# Patient Record
Sex: Female | Born: 1985 | Race: White | Hispanic: No | Marital: Single | Smoking: Current every day smoker
Health system: Southern US, Community
[De-identification: ages and names within clinical notes are randomized; demographics above are authoritative.]

## PROBLEM LIST (undated history)

## (undated) DIAGNOSIS — G8929 Other chronic pain: Secondary | ICD-10-CM

## (undated) DIAGNOSIS — N200 Calculus of kidney: Secondary | ICD-10-CM

---

## 2004-06-17 ENCOUNTER — Emergency Department: Payer: Self-pay | Admitting: Unknown Physician Specialty

## 2005-11-05 ENCOUNTER — Emergency Department: Payer: Self-pay | Admitting: Emergency Medicine

## 2005-11-07 ENCOUNTER — Emergency Department: Payer: Self-pay | Admitting: Emergency Medicine

## 2005-11-15 ENCOUNTER — Emergency Department: Payer: Self-pay | Admitting: Emergency Medicine

## 2006-07-21 ENCOUNTER — Emergency Department: Payer: Self-pay | Admitting: General Practice

## 2006-09-30 ENCOUNTER — Emergency Department: Payer: Self-pay | Admitting: Emergency Medicine

## 2007-03-02 ENCOUNTER — Emergency Department: Payer: Self-pay | Admitting: Internal Medicine

## 2007-04-04 ENCOUNTER — Emergency Department: Payer: Self-pay | Admitting: Emergency Medicine

## 2007-05-26 ENCOUNTER — Emergency Department: Payer: Self-pay | Admitting: Emergency Medicine

## 2007-06-05 ENCOUNTER — Emergency Department: Payer: Self-pay | Admitting: Emergency Medicine

## 2007-06-25 ENCOUNTER — Emergency Department: Payer: Self-pay | Admitting: Emergency Medicine

## 2007-08-05 ENCOUNTER — Emergency Department: Payer: Self-pay | Admitting: Emergency Medicine

## 2007-09-03 ENCOUNTER — Emergency Department: Payer: Self-pay | Admitting: Unknown Physician Specialty

## 2007-10-19 ENCOUNTER — Emergency Department: Payer: Self-pay | Admitting: Emergency Medicine

## 2007-12-01 ENCOUNTER — Emergency Department: Payer: Self-pay | Admitting: Emergency Medicine

## 2007-12-17 ENCOUNTER — Emergency Department: Payer: Self-pay | Admitting: Emergency Medicine

## 2007-12-31 ENCOUNTER — Emergency Department: Payer: Self-pay | Admitting: Emergency Medicine

## 2008-05-06 ENCOUNTER — Emergency Department: Payer: Self-pay | Admitting: Emergency Medicine

## 2008-06-06 ENCOUNTER — Emergency Department: Payer: Self-pay | Admitting: Emergency Medicine

## 2008-07-13 ENCOUNTER — Ambulatory Visit: Payer: Self-pay | Admitting: Obstetrics and Gynecology

## 2008-07-19 ENCOUNTER — Ambulatory Visit: Payer: Self-pay | Admitting: Obstetrics and Gynecology

## 2008-09-08 ENCOUNTER — Emergency Department: Payer: Self-pay | Admitting: Emergency Medicine

## 2008-11-20 ENCOUNTER — Emergency Department: Payer: Self-pay | Admitting: Internal Medicine

## 2009-08-16 ENCOUNTER — Emergency Department: Payer: Self-pay | Admitting: Emergency Medicine

## 2009-12-06 ENCOUNTER — Emergency Department: Payer: Self-pay | Admitting: Emergency Medicine

## 2009-12-14 ENCOUNTER — Ambulatory Visit: Payer: Self-pay | Admitting: Surgery

## 2009-12-19 LAB — PATHOLOGY REPORT

## 2010-09-06 ENCOUNTER — Emergency Department: Payer: Self-pay | Admitting: Emergency Medicine

## 2010-09-25 ENCOUNTER — Emergency Department: Payer: Self-pay | Admitting: Emergency Medicine

## 2010-09-28 ENCOUNTER — Emergency Department: Payer: Self-pay | Admitting: Unknown Physician Specialty

## 2010-10-29 ENCOUNTER — Emergency Department: Payer: Self-pay | Admitting: Internal Medicine

## 2010-11-02 ENCOUNTER — Emergency Department: Payer: Self-pay | Admitting: Emergency Medicine

## 2010-12-24 ENCOUNTER — Emergency Department: Payer: Self-pay | Admitting: Emergency Medicine

## 2011-01-23 ENCOUNTER — Emergency Department: Payer: Self-pay | Admitting: Emergency Medicine

## 2011-02-12 ENCOUNTER — Emergency Department: Payer: Self-pay | Admitting: Emergency Medicine

## 2011-04-09 ENCOUNTER — Emergency Department: Payer: Self-pay | Admitting: Emergency Medicine

## 2011-06-12 ENCOUNTER — Emergency Department: Payer: Self-pay | Admitting: Emergency Medicine

## 2011-06-28 ENCOUNTER — Emergency Department: Payer: Self-pay | Admitting: Emergency Medicine

## 2011-06-28 LAB — URINALYSIS, COMPLETE
Glucose,UR: NEGATIVE mg/dL (ref 0–75)
Ketone: NEGATIVE
Nitrite: NEGATIVE
Ph: 7 (ref 4.5–8.0)
Protein: 100
Specific Gravity: 1.019 (ref 1.003–1.030)
Squamous Epithelial: 11
WBC UR: 7 /HPF (ref 0–5)

## 2011-06-28 LAB — COMPREHENSIVE METABOLIC PANEL
Albumin: 4.1 g/dL (ref 3.4–5.0)
Alkaline Phosphatase: 74 U/L (ref 50–136)
Anion Gap: 9 (ref 7–16)
BUN: 10 mg/dL (ref 7–18)
Chloride: 107 mmol/L (ref 98–107)
Glucose: 86 mg/dL (ref 65–99)
Osmolality: 281 (ref 275–301)
Potassium: 3.8 mmol/L (ref 3.5–5.1)
SGOT(AST): 19 U/L (ref 15–37)
SGPT (ALT): 16 U/L
Total Protein: 7.7 g/dL (ref 6.4–8.2)

## 2011-06-28 LAB — CBC
HCT: 41.6 % (ref 35.0–47.0)
HGB: 14.1 g/dL (ref 12.0–16.0)
MCHC: 33.8 g/dL (ref 32.0–36.0)
MCV: 92 fL (ref 80–100)
Platelet: 315 10*3/uL (ref 150–440)
RBC: 4.54 10*6/uL (ref 3.80–5.20)
RDW: 12.8 % (ref 11.5–14.5)

## 2011-06-28 LAB — PREGNANCY, URINE: Pregnancy Test, Urine: NEGATIVE m[IU]/mL

## 2011-10-24 ENCOUNTER — Emergency Department: Payer: Self-pay | Admitting: Emergency Medicine

## 2011-10-24 LAB — CBC
HCT: 40.9 % (ref 35.0–47.0)
MCHC: 34.1 g/dL (ref 32.0–36.0)
MCV: 91 fL (ref 80–100)
Platelet: 291 10*3/uL (ref 150–440)
RDW: 13 % (ref 11.5–14.5)

## 2011-10-24 LAB — WET PREP, GENITAL

## 2011-10-24 LAB — URINALYSIS, COMPLETE
Bilirubin,UR: NEGATIVE
Ketone: NEGATIVE
Protein: NEGATIVE
Specific Gravity: 1.014 (ref 1.003–1.030)
WBC UR: 5 /HPF (ref 0–5)

## 2011-10-24 LAB — HCG, QUANTITATIVE, PREGNANCY: Beta Hcg, Quant.: 34898 m[IU]/mL — ABNORMAL HIGH

## 2012-01-15 ENCOUNTER — Ambulatory Visit: Payer: Self-pay | Admitting: Family Medicine

## 2012-02-05 ENCOUNTER — Observation Stay: Payer: Self-pay

## 2012-02-05 LAB — URINALYSIS, COMPLETE
Glucose,UR: NEGATIVE mg/dL (ref 0–75)
Ketone: NEGATIVE
Leukocyte Esterase: NEGATIVE
Nitrite: NEGATIVE
Ph: 7 (ref 4.5–8.0)
Protein: NEGATIVE
Specific Gravity: 1.001 (ref 1.003–1.030)

## 2012-02-18 ENCOUNTER — Observation Stay: Payer: Self-pay | Admitting: Obstetrics and Gynecology

## 2012-02-18 LAB — FETAL FIBRONECTIN
Appearance: NORMAL
Fetal Fibronectin: NEGATIVE

## 2012-02-18 LAB — URINALYSIS, COMPLETE
Bilirubin,UR: NEGATIVE
Blood: NEGATIVE
Ketone: NEGATIVE
Nitrite: NEGATIVE
Ph: 8 (ref 4.5–8.0)
Protein: NEGATIVE
WBC UR: 15 /HPF (ref 0–5)

## 2012-03-29 ENCOUNTER — Observation Stay: Payer: Self-pay

## 2012-03-29 LAB — URINALYSIS, COMPLETE
Bacteria: NONE SEEN
Nitrite: NEGATIVE
Ph: 5 (ref 4.5–8.0)
Protein: 100
WBC UR: 11 /HPF (ref 0–5)

## 2012-04-20 ENCOUNTER — Inpatient Hospital Stay: Payer: Self-pay | Admitting: Obstetrics & Gynecology

## 2012-04-20 LAB — CBC WITH DIFFERENTIAL/PLATELET
Eosinophil %: 0.5 %
HCT: 31.4 % — ABNORMAL LOW (ref 35.0–47.0)
MCH: 37.8 pg — ABNORMAL HIGH (ref 26.0–34.0)
MCV: 104 fL — ABNORMAL HIGH (ref 80–100)
Monocyte #: 1.3 x10 3/mm — ABNORMAL HIGH (ref 0.2–0.9)
Monocyte %: 6.1 %

## 2012-04-20 LAB — DRUG SCREEN, URINE
Benzodiazepine, Ur Scrn: NEGATIVE (ref ?–200)
Cocaine Metabolite,Ur ~~LOC~~: NEGATIVE (ref ?–300)
Phencyclidine (PCP) Ur S: NEGATIVE (ref ?–25)
Tricyclic, Ur Screen: NEGATIVE (ref ?–1000)

## 2012-04-21 LAB — HEMATOCRIT: HCT: 13 % — CL (ref 35.0–47.0)

## 2012-04-22 LAB — HEMATOCRIT: HCT: 17.3 % — ABNORMAL LOW (ref 35.0–47.0)

## 2012-06-29 ENCOUNTER — Emergency Department: Payer: Self-pay | Admitting: Internal Medicine

## 2012-11-13 ENCOUNTER — Emergency Department: Payer: Self-pay | Admitting: Emergency Medicine

## 2012-12-08 ENCOUNTER — Emergency Department: Payer: Self-pay | Admitting: Emergency Medicine

## 2013-03-18 ENCOUNTER — Emergency Department: Payer: Self-pay | Admitting: Emergency Medicine

## 2013-03-18 LAB — RAPID INFLUENZA A&B ANTIGENS

## 2013-03-19 LAB — BETA STREP CULTURE(ARMC)

## 2013-03-21 ENCOUNTER — Emergency Department: Payer: Self-pay | Admitting: Emergency Medicine

## 2013-03-21 LAB — BASIC METABOLIC PANEL
Anion Gap: 6 — ABNORMAL LOW (ref 7–16)
BUN: 8 mg/dL (ref 7–18)
CO2: 27 mmol/L (ref 21–32)
CREATININE: 0.59 mg/dL — AB (ref 0.60–1.30)
Calcium, Total: 9.3 mg/dL (ref 8.5–10.1)
Chloride: 106 mmol/L (ref 98–107)
EGFR (African American): >60
Glucose: 91 mg/dL (ref 65–99)
Osmolality: 275 (ref 275–301)
Potassium: 4.1 mmol/L (ref 3.5–5.1)
SODIUM: 139 mmol/L (ref 136–145)

## 2013-03-21 LAB — CBC
HCT: 39 % (ref 35.0–47.0)
HGB: 12.9 g/dL (ref 12.0–16.0)
MCH: 29.2 pg (ref 26.0–34.0)
MCHC: 33.1 g/dL (ref 32.0–36.0)
MCV: 88 fL (ref 80–100)
PLATELETS: 367 10*3/uL (ref 150–440)
RBC: 4.41 10*6/uL (ref 3.80–5.20)
RDW: 13.5 % (ref 11.5–14.5)
WBC: 12.4 10*3/uL — AB (ref 3.6–11.0)

## 2013-03-24 LAB — BETA STREP CULTURE(ARMC)

## 2013-04-05 ENCOUNTER — Emergency Department: Payer: Self-pay | Admitting: Emergency Medicine

## 2013-06-23 ENCOUNTER — Emergency Department: Payer: Self-pay | Admitting: Emergency Medicine

## 2013-06-23 LAB — URINALYSIS, COMPLETE
Bacteria: NONE SEEN
Bilirubin,UR: NEGATIVE
Blood: NEGATIVE
GLUCOSE, UR: NEGATIVE mg/dL (ref 0–75)
Ketone: NEGATIVE
Nitrite: NEGATIVE
Ph: 6 (ref 4.5–8.0)
Protein: NEGATIVE
RBC,UR: 3 /HPF (ref 0–5)
Specific Gravity: 1.021 (ref 1.003–1.030)
Squamous Epithelial: 23
WBC UR: 3 /HPF (ref 0–5)

## 2013-06-23 LAB — CBC
HCT: 38.6 % (ref 35.0–47.0)
HGB: 12.8 g/dL (ref 12.0–16.0)
MCH: 29.9 pg (ref 26.0–34.0)
MCHC: 33.2 g/dL (ref 32.0–36.0)
MCV: 90 fL (ref 80–100)
PLATELETS: 341 10*3/uL (ref 150–440)
RBC: 4.29 10*6/uL (ref 3.80–5.20)
RDW: 14.1 % (ref 11.5–14.5)
WBC: 10.7 10*3/uL (ref 3.6–11.0)

## 2013-06-23 LAB — PREGNANCY, URINE: Pregnancy Test, Urine: POSITIVE m[IU]/mL

## 2013-06-23 LAB — HCG, QUANTITATIVE, PREGNANCY: Beta Hcg, Quant.: 2594 m[IU]/mL — ABNORMAL HIGH

## 2013-12-28 ENCOUNTER — Emergency Department: Payer: Self-pay | Admitting: Emergency Medicine

## 2014-02-02 IMAGING — US US OB < 14 WEEKS - US OB TV
1 series · 14 of 28 positions shown · non-contrast
Comparison: none

REASON FOR EXAM: vaginal bleeding in pregnancy
COMMENTS:

PROCEDURE:     US  - US OB LESS THAN 14 WEEKS/W TRANS  - October 24, 2011  [DATE]
RESULT:     History: Bleeding.
Comparison Study: No prior.

[Series 1: us ob < 14 weeks - us ob tv · 0.30mm/px · 14 of 50 slices shown]
[im 2/50]
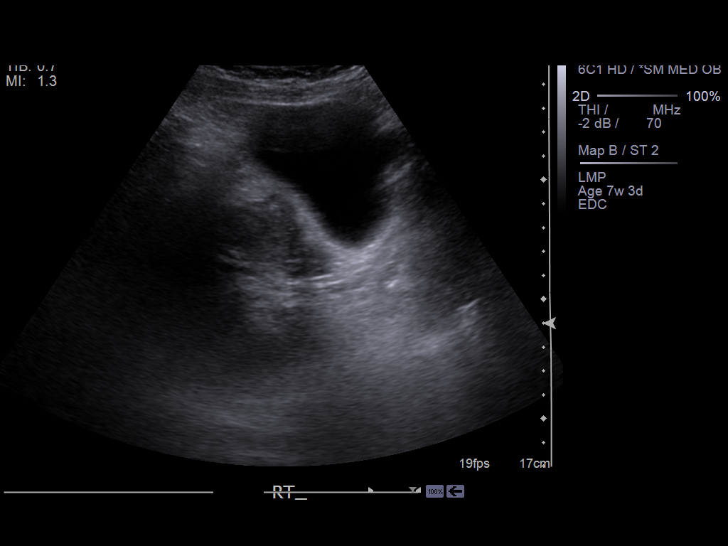
[im 6/50]
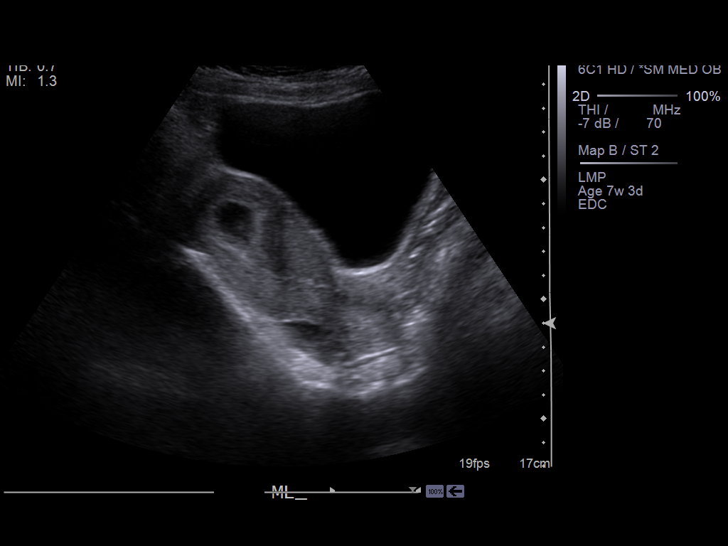
[im 10/50]
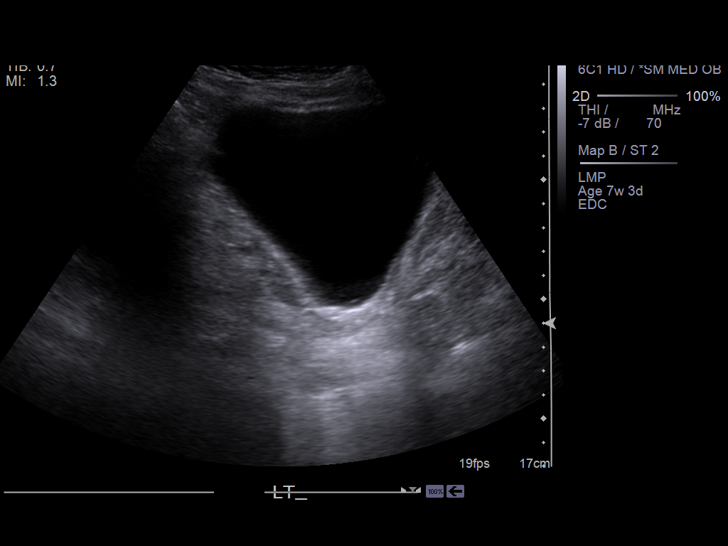
[im 13/50]
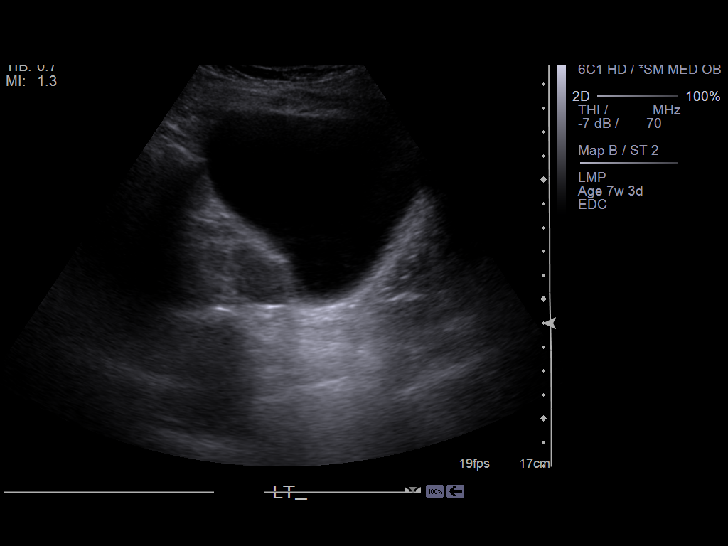
[im 17/50]
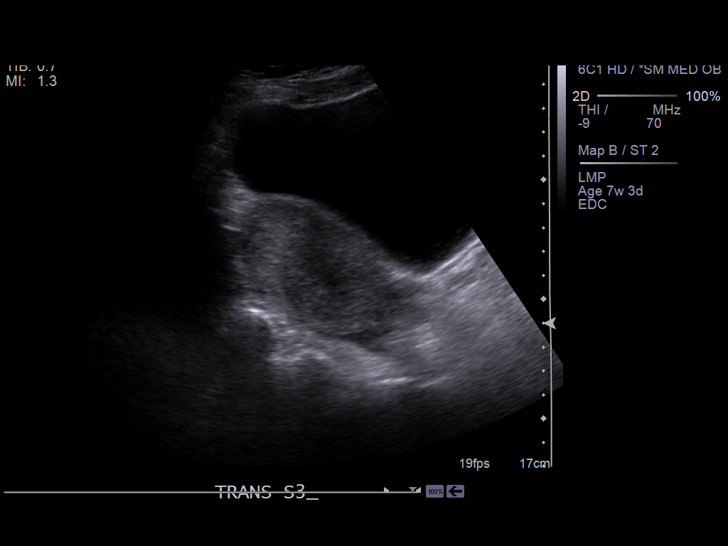
[im 20/50]
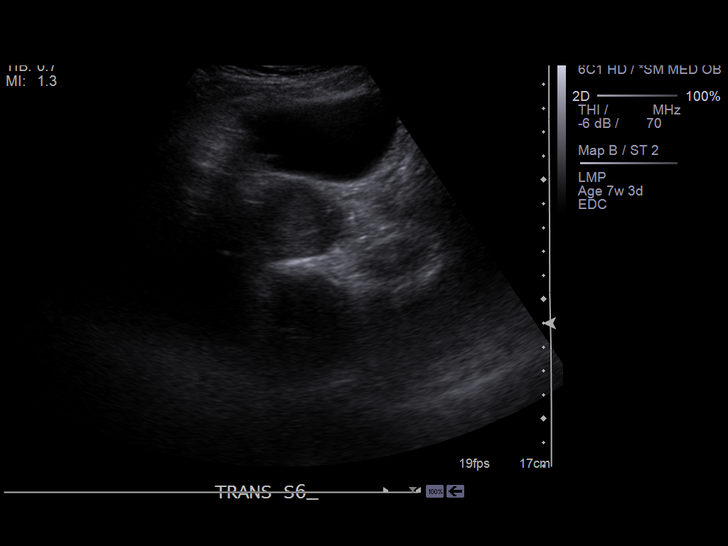
[im 24/50]
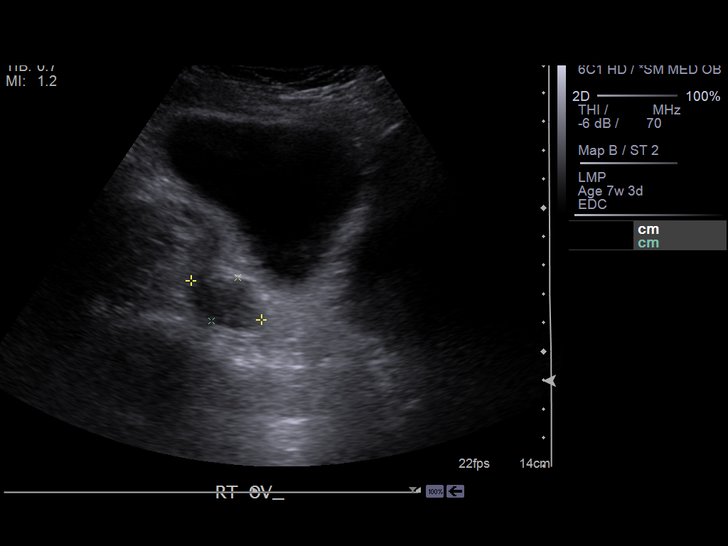
[im 28/50]
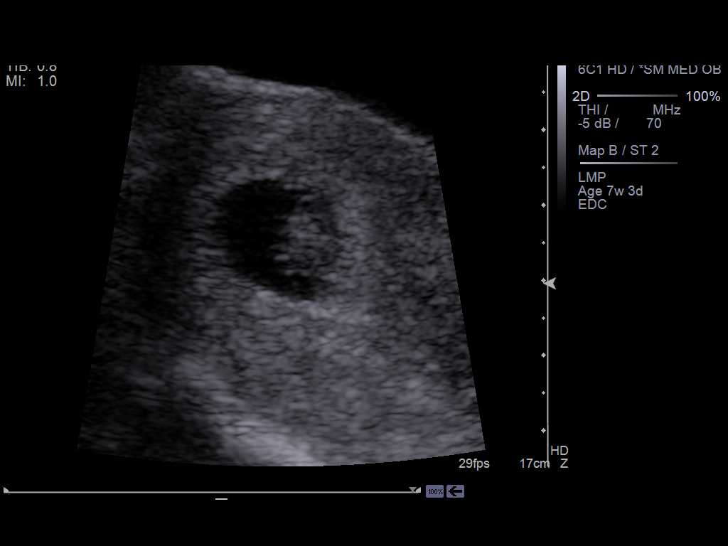
[im 31/50]
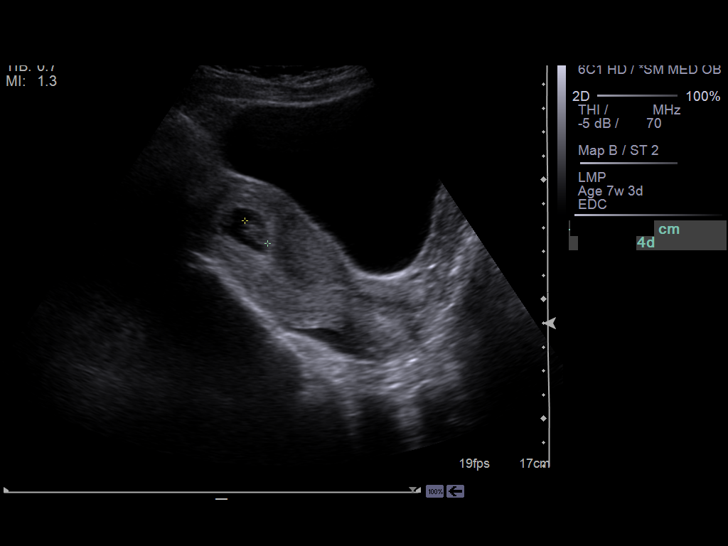
[im 35/50]
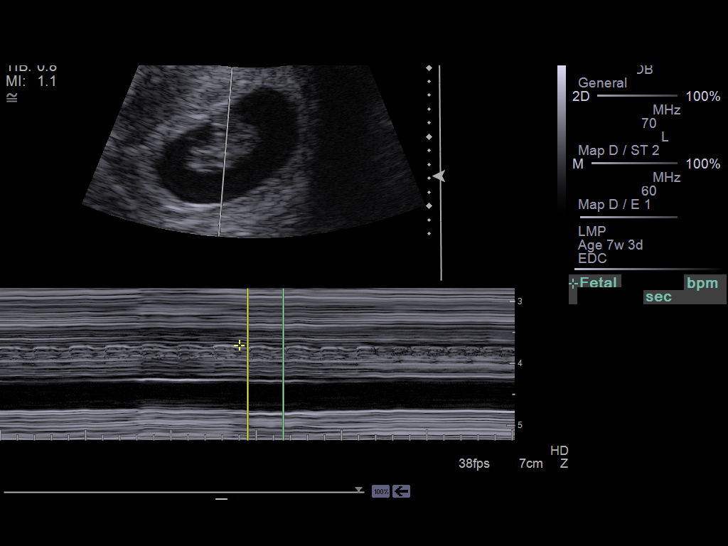
[im 39/50]
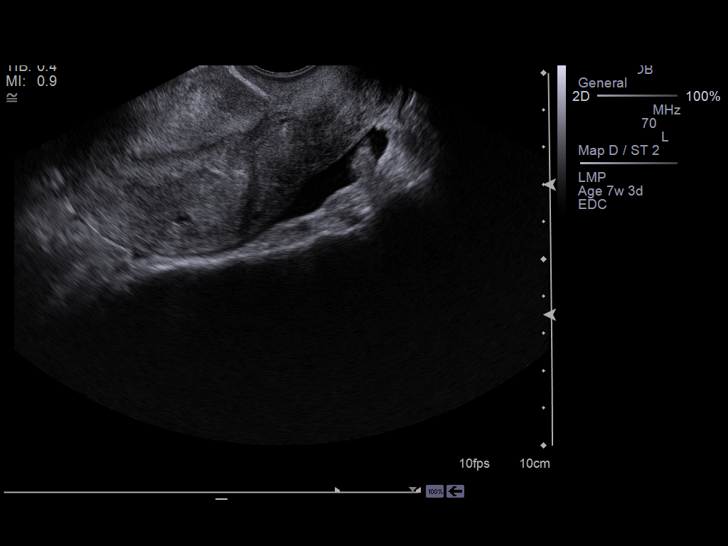
[im 42/50]
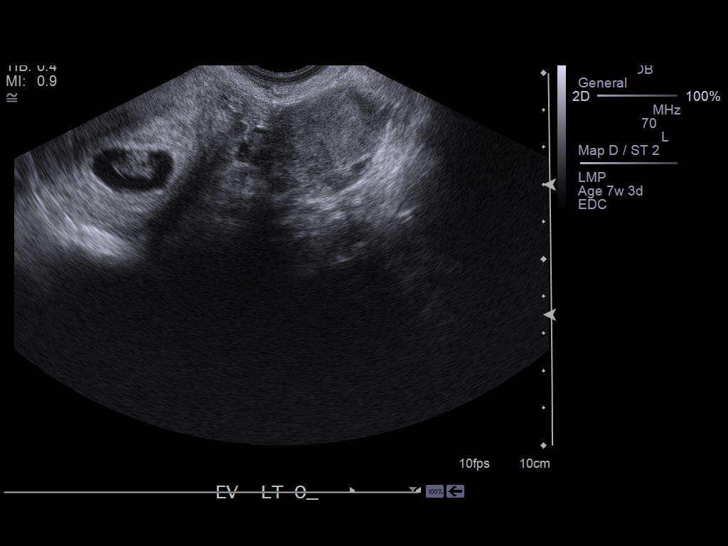
[im 46/50]
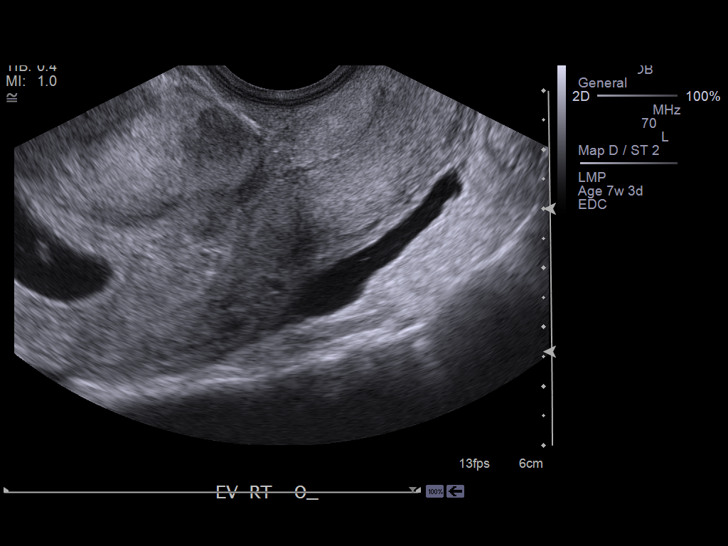
[im 50/50]
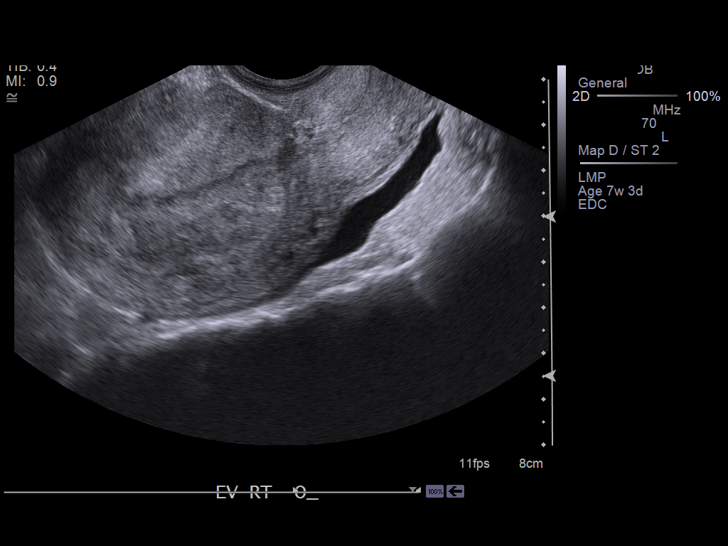

[14 of 28 positions shown; findings below may reference images not displayed]

FINDINGS: Single viable intrauterine is present with crown-rump length of
1.35 cm corresponding to 7 weeks 4 days. Fetal heart rate of 144 beats per
minute noted. Trace fluid in cul-de-sac. Ovaries are unremarkable.
IMPRESSION: Single viable intrauterine pregnancy at 7 weeks 4 days.
Trace amount fluid noted cul-de-sac.

## 2014-07-09 NOTE — Op Note (Signed)
PATIENT NAME:  Courtney Schwartz, Courtney Schwartz MR#:  [id number] DATE OF BIRTH:  02-24-1986  DATE OF PROCEDURE:  04/20/2012  PREOPERATIVE DIAGNOSIS: Intrauterine fetal demise at 33 weeks, placental abruption.  POSTOPERATIVE DIAGNOSIS: Intrauterine fetal demise at 33 weeks, placental abruption.  PROCEDURE: Low transverse cesarean section, repeat.   SURGEON: Senaida LangeLashawn Weaver-Lee, M.D.   ASSISTANRonnald Ramp: Tammy Gordy, scrub tech.   ANESTHESIA: Spinal.   ESTIMATED BLOOD LOSS: 500 mL.  OPERATIVE FLUIDS: 1 L.   COMPLICATIONS: None.   FINDINGS: Placental abruption, female vertex infant, 3 pounds 14 ounces, Apgars 0 and 0. Normal uterus, tubes, and ovaries. Placenta to pathology as a specimen.  INDICATION: The patient is a 29 year old, who presents with the complaint of back pain and contractions. Upon attempting to identify fetal heart tones for monitoring, the nursing staff was unable to do so. A bedside ultrasound was performed and no fetal cardiac activity was identified. IUFD was then diagnosed. The patient subsequently had several gushes of blood making placental abruption likely. The patient had a history of a prior cesarean section and desired repeat cesarean section. Risks of VBAC versus repeat cesarean was explained to the patient.   PROCEDURE: The patient was taken to the operating room with IV fluids running. She was prepped and draped in the usual sterile fashion with a leftward tilt. A Pfannenstiel skin incision was made and carried down to underlying fascia with the knife. The fascia was nicked in the midline. The incision was extended laterally. The superior aspect of the fascia was grasped with Kocher clamps and the underlying rectus muscles were dissected off. The peritoneum had been entered at this time and the opening was extended. The bladder blade was placed. The vesicouterine peritoneum was grasped with the pick-ups and entered sharply with the Metzenbaum. A bladder flap was created digitally. Bladder  blade was then replaced, and a hysterotomy incision was made and carried down to the underlying fetal membranes, which were ruptured. At this time, copious amounts of blood clot were encountered. The infant's head was grasped and delivered through the hysterotomy incision. The anterior and posterior shoulders were delivered followed by the remainder of the body. The cord was clamped x 2 and cut. The placenta was found to be free floating at this time secondary to abruption and it was easily pulled out of the uterus via the umbilical core. The uterus was exteriorized and cleared of all clot and debris. The hysterotomy incision was closed with a #0 Vicryl suture in a running locked fashion. The uterus was returned to the abdomen. The abdomen and gutters were irrigated with copious amounts of warm normal saline. The peritoneum was closed with 2-0 Vicryl. The On-Q pump apparatus was placed according to manufacturer's instructions. The fascia was closed with a #1 PDS. The skin was closed with staples. The openings through which the On-Q catheters were placed were secured using Dermabond skin glue. The catheters were then bolused with 0.5% Sensorcaine, 5 mm in each catheter. The catheters were secured to the abdomen using Steri-Strips and Tegaderm. The patient tolerated the procedure well. Sponge, needle and instrument counts were correct x 2. The patient was taken to the recovery room in stable condition.  ____________________________ Sonda PrimesLashawn A. Patton SallesWeaver-Lee, MD law:aw D: 04/20/2012 12:52:52 ET T: 04/21/2012 07:36:10 ET JOB#: [id number]  cc: Flint MelterLashawn A. Patton SallesWeaver-Lee, MD, <Dictator> Sonda PrimesLASHAWN A WEAVER LEE MD ELECTRONICALLY SIGNED 04/25/2012 8:00
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
## 2014-07-27 NOTE — H&P (Signed)
L&D Evaluation:  History Expanded:   HPI 29 year old G2 P1001 presented a 6630 1/7 with EDD of 06/06/12 weeks with c/o nausea, vomiting and diarrhea along with low abdominal cramping since 0600 this am. + sick contacts with similar symptoms. PNC at [facility]. H/o C-section for FTP in 2005.    Gravida 2    Term 1    PreTerm 0    Abortion 0    Living 1    Blood Type (Maternal) O positive    Group B Strep Results Maternal (Result >5wks must be treated as unknown) unknown/result > 5 weeks ago    Maternal HIV Negative    Maternal Syphilis Ab Nonreactive    Maternal Varicella Immune    Rubella Results (Maternal) immune    Maternal T-Dap Immune    EDC 09-Jun-2012    Presents with contractions    Patient's Medical History Endometriosis, migraines    Patient's Surgical History Previous C-Section  Laprascopic cholecystectomy, dx laparoscopy, internal fixation of  fx of left femur    Medications Pre Natal Vitamins  Tylenol (Acetaminophen)    Allergies Phenergan (hallucinations), morphine (rash), Tramadol (N/V/D), Darvocet (N/V/D), Toradol (N/V/D), shellfish (rash), Latex (rash).    Social History tobacco  5 cigs/day    Family History Non-Contributory    Current Prenatal Course Notable For back pain   ROS:   ROS see HPI    General feels hot    MS s   Exam:   Vital Signs BP stable, pulse 120s, T=99.5    General appears uncomfortable    Mental Status clear    Chest clear    Abdomen gravid, mild generalized tenderness    Edema no edema    Reflexes 2+    Pelvic no external lesions, cervix closed and thick    Mebranes Intact    FHT baseline 165, + accelerations, mod variability    Ucx irregular    Skin dry   Impression:   Impression IUP at 30 weeks, suspected viral gastroenteritis   Plan:   Plan UA    Comments IV fluids, 500 mL bolus IV Zofran Kpad/warm blankets for cramping pain   Electronic Signatures: Vella KohlerBrothers, Bruk Tumolo K (CNM)  (Signed 11-Jan-14  20:34)  Authored: L&D Evaluation   Last Updated: 11-Jan-14 20:34 by Vella KohlerBrothers, Alejandria Wessells K (CNM)
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
## 2015-03-21 ENCOUNTER — Emergency Department
Admission: EM | Admit: 2015-03-21 | Discharge: 2015-03-21 | Disposition: A | Discharge status: Home / Self Care | Payer: Medicaid Other | Attending: Emergency Medicine | Admitting: Emergency Medicine

## 2015-03-21 ENCOUNTER — Emergency Department: Payer: Medicaid Other

## 2015-03-21 DIAGNOSIS — W010XXA Fall on same level from slipping, tripping and stumbling without subsequent striking against object, initial encounter: Secondary | ICD-10-CM | POA: Insufficient documentation

## 2015-03-21 DIAGNOSIS — Y9301 Activity, walking, marching and hiking: Secondary | ICD-10-CM | POA: Diagnosis not present

## 2015-03-21 DIAGNOSIS — Y9289 Other specified places as the place of occurrence of the external cause: Secondary | ICD-10-CM | POA: Diagnosis not present

## 2015-03-21 DIAGNOSIS — Y998 Other external cause status: Secondary | ICD-10-CM | POA: Diagnosis not present

## 2015-03-21 DIAGNOSIS — S93402A Sprain of unspecified ligament of left ankle, initial encounter: Secondary | ICD-10-CM | POA: Diagnosis not present

## 2015-03-21 DIAGNOSIS — S99922A Unspecified injury of left foot, initial encounter: Secondary | ICD-10-CM | POA: Diagnosis present

## 2015-03-21 MED ORDER — NAPROXEN 500 MG PO TABS: 500.0000 mg | ORAL_TABLET | Freq: Two times a day (BID) | ORAL | Status: DC

## 2015-03-21 NOTE — ED Notes (Signed)
AAOx3.  Skin warm and dry.  NAD 

## 2015-03-21 NOTE — ED Provider Notes (Signed)
[facility] Emergency Department Provider Note ?  ? ____________________________________________ ? Time seen: 9:40 PM ? I have reviewed the triage vital signs and the nursing notes.  ________ HISTORY ? Chief Complaint Foot Pain     HPI  Courtney Schwartz is a 30 y.o. female   who presents emergency department complaining of left foot pain. She states that she was walking down a flight of outside stairs when she slipped falling landing on the left foot. She states that she has a history of spraining left ankle and the pain is severe at this time. She also endorses hearing a "pop" to her foot during fall. ? ? ? No past medical history on file.  There are no active problems to display for this patient.  ? No past surgical history on file. ? Current Outpatient Rx  Name  Route  Sig  Dispense  Refill  . naproxen (NAPROSYN) 500 MG tablet   Oral   Take 1 tablet (500 mg total) by mouth 2 (two) times daily with a meal.   60 tablet   0    ? Allergies Morphine and related; Toradol; and Tramadol ? No family history on file. ? Social History Social History  Substance Use Topics  . Smoking status: Not on file  . Smokeless tobacco: Not on file  . Alcohol Use: Not on file   ? Review of Systems Constitutional: no fever. Eyes: no discharge ENT: no sore throat. Cardiovascular: no chest pain. Respiratory: no cough. No sob Gastrointestinal: denies abdominal pain, vomiting, diarrhea, and constipation Genitourinary: no dysuria. Negative for hematuria Musculoskeletal: Negative for back pain. Endorses left foot pain. Skin: Negative for rash. Neurological: Negative for headaches  10-point ROS otherwise negative.  _______________ PHYSICAL EXAM: ? VITAL SIGNS:   ED Triage Vitals  Enc Vitals Group     BP 03/21/15 1942 126/90 mmHg     Pulse Rate 03/21/15 1942 105     Resp 03/21/15 1942 20     Temp 03/21/15 1942 97.9 F (36.6 C)     Temp Source  03/21/15 1942 Oral     SpO2 03/21/15 1942 97 %     Weight 03/21/15 1942 135 lb (61.236 kg)     Height 03/21/15 1942 5\' 1"  (1.549 m)     Head Cir --      Peak Flow --      Pain Score 03/21/15 1943 8     Pain Loc --      Pain Edu? --      Excl. in GC? --    ?  Constitutional: Alert and oriented. Well appearing and in no distress. Eyes: Conjunctivae are normal.  ENT      Head: Normocephalic and atraumatic.      Ears:       Nose: No congestion/rhinnorhea.      Mouth/Throat: Mucous membranes are moist.   Hematological/Lymphatic/Immunilogical: No cervical lymphadenopathy. Cardiovascular: Normal rate, regular rhythm. Normal S1 and S2. Respiratory: Normal respiratory effort without tachypnea nor retractions. Lungs CTAB. Gastrointestinal: Soft and nontender. No distention. There is no CVA tenderness. Genitourinary:  Musculoskeletal: Nontender with normal range of motion in all extremities. No visible deformity to left foot when compared with right. Limited range of motion of ankle due to pain. Patient is tender to palpation over the tarsal bones. No palpable abnormality. Dorsalis pedis pulse is palpated. Sensation is present and equal to the unaffected foot.  Neurologic:  Normal speech and language. No gross focal neurologic deficits  are appreciated. Skin:  Skin is warm, dry and intact. No rash noted. Psychiatric: Mood and affect are normal. Speech and behavior are normal. Patient exhibits appropriate insight and judgment.    LABS (all labs ordered are listed, but only abnormal results are displayed)  Labs Reviewed - No data to display  ___________ RADIOLOGY  Left foot x-ray Impression: No fracture dislocation.   I personally reviewed the images.  _____________ PROCEDURES ? Procedure(s) performed:    Medications - No data to display  ______________________________________________________ INITIAL IMPRESSION / ASSESSMENT AND PLAN / ED COURSE ? Pertinent labs & imaging  results that were available during my care of the patient were reviewed by me and considered in my medical decision making (see chart for details).    Patient's diagnosis is consistent with left ankle sprain. She will be given Ace bandage her in emergency department and discharged home with anti-inflammatories and pain medication. Patient will follow-up with orthopedics if symptoms persist past treatment course. Patient verbalizes understanding of her diagnosis and treatment plan and verbalizes compliance with same.    New Prescriptions   NAPROXEN (NAPROSYN) 500 MG TABLET    Take 1 tablet (500 mg total) by mouth 2 (two) times daily with a meal.   ____________________________________________ FINAL CLINICAL IMPRESSION(S) / ED DIAGNOSES?  Final diagnoses:  Ankle sprain, left, initial encounter            Racheal Patches, PA-C 03/21/15 [id number]  Jennye Moccasin, MD 03/21/15 2328

## 2015-03-21 NOTE — Discharge Instructions (Signed)
Ankle Sprain °An ankle sprain is an injury to the strong, fibrous tissues (ligaments) that hold the bones of your ankle joint together.  °CAUSES °An ankle sprain is usually caused by a fall or by twisting your ankle. Ankle sprains most commonly occur when you step on the outer edge of your foot, and your ankle turns inward. People who participate in sports are more prone to these types of injuries.  °SYMPTOMS  °· Pain in your ankle. The pain may be present at rest or only when you are trying to stand or walk. °· Swelling. °· Bruising. Bruising may develop immediately or within 1 to 2 days after your injury. °· Difficulty standing or walking, particularly when turning corners or changing directions. °DIAGNOSIS  °Your caregiver will ask you details about your injury and perform a physical exam of your ankle to determine if you have an ankle sprain. During the physical exam, your caregiver will press on and apply pressure to specific areas of your foot and ankle. Your caregiver will try to move your ankle in certain ways. An X-ray exam may be done to be sure a bone was not broken or a ligament did not separate from one of the bones in your ankle (avulsion fracture).  °TREATMENT  °Certain types of braces can help stabilize your ankle. Your caregiver can make a recommendation for this. Your caregiver may recommend the use of medicine for pain. If your sprain is severe, your caregiver may refer you to a surgeon who helps to restore function to parts of your skeletal system (orthopedist) or a physical therapist. °HOME CARE INSTRUCTIONS  °· Apply ice to your injury for 1-2 days or as directed by your caregiver. Applying ice helps to reduce inflammation and pain. °· Put ice in a plastic bag. °· Place a towel between your skin and the bag. °· Leave the ice on for 15-20 minutes at a time, every 2 hours while you are awake. °· Only take over-the-counter or prescription medicines for pain, discomfort, or fever as directed by  your caregiver. °· Elevate your injured ankle above the level of your heart as much as possible for 2-3 days. °· If your caregiver recommends crutches, use them as instructed. Gradually put weight on the affected ankle. Continue to use crutches or a cane until you can walk without feeling pain in your ankle. °· If you have a plaster splint, wear the splint as directed by your caregiver. Do not rest it on anything harder than a pillow for the first 24 hours. Do not put weight on it. Do not get it wet. You may take it off to take a shower or bath. °· You may have been given an elastic bandage to wear around your ankle to provide support. If the elastic bandage is too tight (you have numbness or tingling in your foot or your foot becomes cold and blue), adjust the bandage to make it comfortable. °· If you have an air splint, you may blow more air into it or let air out to make it more comfortable. You may take your splint off at night and before taking a shower or bath. Wiggle your toes in the splint several times per day to decrease swelling. °SEEK MEDICAL CARE IF:  °· You have rapidly increasing bruising or swelling. °· Your toes feel extremely cold or you lose feeling in your foot. °· Your pain is not relieved with medicine. °SEEK IMMEDIATE MEDICAL CARE IF: °· Your toes are numb or blue. °·   You have severe pain that is increasing. °MAKE SURE YOU:  °· Understand these instructions. °· Will watch your condition. °· Will get help right away if you are not doing well or get worse. °  °This information is not intended to replace advice given to you by your health care provider. Make sure you discuss any questions you have with your health care provider. °  °Document Released: 03/05/2005 Document Revised: 03/26/2014 Document Reviewed: 03/17/2011 °Elsevier Interactive Patient Education ©2016 [employer] ° °Elastic Bandage and RICE °WHAT DOES AN ELASTIC BANDAGE DO? °Elastic bandages come in different shapes and sizes.  They generally provide support to your injury and reduce swelling while you are healing, but they can perform different functions. Your health care provider will help you to decide what is best for your protection, recovery, or rehabilitation following an injury. °WHAT ARE SOME GENERAL TIPS FOR USING AN ELASTIC BANDAGE? °· Use the bandage as directed by the maker of the bandage that you are using. °· Do not wrap the bandage too tightly. This may cut off the circulation in the arm or leg in the area below the bandage. °· If part of your body beyond the bandage becomes blue, numb, cold, swollen, or is more painful, your bandage is most likely too tight. If this occurs, remove your bandage and reapply it more loosely. °· See your health care provider if the bandage seems to be making your problems worse rather than better. °· An elastic bandage should be removed and reapplied every 3-4 hours or as directed by your health care provider. °WHAT IS RICE? °The routine care of many injuries includes rest, ice, compression, and elevation (RICE therapy).  °Rest °Rest is required to allow your body to heal. Generally, you can resume your routine activities when you are comfortable and have been given permission by your health care provider. °Ice °Icing your injury helps to keep the swelling down and it reduces pain. Do not apply ice directly to your skin. °· Put ice in a plastic bag. °· Place a towel between your skin and the bag. °· Leave the ice on for 20 minutes, 2-3 times per day. °Do this for as long as you are directed by your health care provider. °Compression °Compression helps to keep swelling down, gives support, and helps with discomfort. Compression may be done with an elastic bandage. °Elevation °Elevation helps to reduce swelling and it decreases pain. If possible, your injured area should be placed at or above the level of your heart or the center of your chest. °WHEN SHOULD I SEEK MEDICAL CARE? °You should seek  medical care if: °· You have persistent pain and swelling. °· Your symptoms are getting worse rather than improving. °These symptoms may indicate that further evaluation or further X-rays are needed. Sometimes, X-rays may not show a small broken bone (fracture) until a number of days later. Make a follow-up appointment with your health care provider. Ask when your X-ray results will be ready. Make sure that you get your X-ray results. °WHEN SHOULD I SEEK IMMEDIATE MEDICAL CARE? °You should seek immediate medical care if: °· You have a sudden onset of severe pain at or below the area of your injury. °· You develop redness or increased swelling around your injury. °· You have tingling or numbness at or below the area of your injury that does not improve after you remove the elastic bandage. °  °This information is not intended to replace advice given to you by your   health care provider. Make sure you discuss any questions you have with your health care provider. °  °Document Released: 08/25/2001 Document Revised: 11/24/2014 Document Reviewed: 10/19/2013 °Elsevier Interactive Patient Education ©2016 [employer] ° °Stirrup Ankle Brace °Stirrup ankle braces give support and help stabilize the ankle joint. They are rigid pieces of plastic or fiberglass that go up both sides of the lower leg with the bottom of the stirrup fitting comfortably under the bottom of the instep of the foot. It can be held on with Velcro straps or an elastic wrap. Stirrup ankle braces are used to support the ankle following mild or moderate sprains or strains, or fractures after cast removal.  °They can be easily removed or adjusted if there is swelling. The rigid brace shells are designed to fit the ankle comfortably and provide the needed medial/lateral stabilization. This brace can be easily worn with most athletic shoes. The brace liner is usually made of a soft, comfortable gel-like material. This gel fits the ankle well without causing  uncomfortable pressure points.  °IMPORTANCE OF ANKLE BRACES: °· The use of ankle bracing is effective in the prevention of ankle sprains. °· In athletes, the use of ankle bracing will offer protection and prevent further sprains. °· Research shows that a complete rehabilitation program needs to be included with external bracing. This includes range of motion and ankle strengthening exercises. Your caregivers will instruct you in this. °If you were given the brace today for a new injury, use the following home care instructions as a guide. °HOME CARE INSTRUCTIONS  °· Apply ice to the sore area for 15-20 minutes, 03-04 times per day while awake for the first 2 days. Put the ice in a plastic bag and place a towel between the bag of ice and your skin. Never place the ice pack directly on your skin. Be especially careful using ice on an elbow or knee or other bony area, such as your ankle, because icing for too long may damage the nerves which are close to the surface. °· Keep your leg elevated when possible to lessen swelling. °· Wear your splint until you are seen for a follow-up examination. Do not put weight on it. Do not get it wet. You may take it off to take a shower or bath. °· For Activity: Use crutches with non-weight bearing for 1 week. Then, you may walk on your ankle as instructed. Start gradually with weight bearing on the affected ankle. °· Continue to use crutches or a cane until you can stand on your ankle without causing pain. °· Wiggle your toes in the splint several times per day if you are able. °· The splint is too tight if you have numbness, tingling, or if your foot becomes cold and blue. Adjust the straps or elastic bandage to make it comfortable. °· Only take over-the-counter or prescription medicines for pain, discomfort, or fever as directed by your caregiver. °SEEK IMMEDIATE MEDICAL CARE IF:  °· You have increased bruising, swelling or pain. °· Your toes are blue or cold and loosening the  brace or wrap does not help. °· Your pain is not relieved with medicine. °MAKE SURE YOU:  °· Understand these instructions. °· Will watch your condition. °· Will get help right away if you are not doing well or get worse. °  °This information is not intended to replace advice given to you by your health care provider. Make sure you discuss any questions you have with your health care provider. °  °  Document Released: 01/04/2004 Document Revised: 05/28/2011 Document Reviewed: 10/05/2014 °Elsevier Interactive Patient Education ©2016 [employer]vier [employer]. ° °

## 2015-03-21 NOTE — ED Notes (Signed)
Pt to triage via w/c with no distress noted; reports injuring left foot today at 3pm, st "heard a crack"

## 2016-07-15 ENCOUNTER — Emergency Department
Admission: EM | Admit: 2016-07-15 | Discharge: 2016-07-15 | Disposition: A | Discharge status: Home / Self Care | Payer: Medicaid Other | Attending: Emergency Medicine | Admitting: Emergency Medicine

## 2016-07-15 ENCOUNTER — Encounter: Payer: Self-pay | Admitting: Emergency Medicine

## 2016-07-15 DIAGNOSIS — R319 Hematuria, unspecified: Secondary | ICD-10-CM

## 2016-07-15 DIAGNOSIS — F17200 Nicotine dependence, unspecified, uncomplicated: Secondary | ICD-10-CM | POA: Insufficient documentation

## 2016-07-15 DIAGNOSIS — R1031 Right lower quadrant pain: Secondary | ICD-10-CM | POA: Diagnosis present

## 2016-07-15 DIAGNOSIS — N390 Urinary tract infection, site not specified: Secondary | ICD-10-CM | POA: Insufficient documentation

## 2016-07-15 HISTORY — DX: Calculus of kidney: N20.0

## 2016-07-15 HISTORY — DX: Other chronic pain: G89.29

## 2016-07-15 LAB — URINALYSIS, COMPLETE (UACMP) WITH MICROSCOPIC
BILIRUBIN URINE: NEGATIVE
Glucose, UA: NEGATIVE mg/dL
Ketones, ur: NEGATIVE mg/dL
NITRITE: NEGATIVE
PROTEIN: 100 mg/dL — AB
Specific Gravity, Urine: 1.028 (ref 1.005–1.030)
pH: 5 (ref 5.0–8.0)

## 2016-07-15 LAB — COMPREHENSIVE METABOLIC PANEL
ALBUMIN: 4 g/dL (ref 3.5–5.0)
ALK PHOS: 77 U/L (ref 38–126)
ALT: 10 U/L — ABNORMAL LOW (ref 14–54)
ANION GAP: 8 (ref 5–15)
AST: 17 U/L (ref 15–41)
BILIRUBIN TOTAL: 0.4 mg/dL (ref 0.3–1.2)
BUN: 11 mg/dL (ref 6–20)
CO2: 26 mmol/L (ref 22–32)
CREATININE: 0.71 mg/dL (ref 0.44–1.00)
Calcium: 9.1 mg/dL (ref 8.9–10.3)
Chloride: 106 mmol/L (ref 101–111)
GFR calc non Af Amer: >60 mL/min (ref >60)
GLUCOSE: 95 mg/dL (ref 65–99)
Potassium: 3.8 mmol/L (ref 3.5–5.1)
Sodium: 140 mmol/L (ref 135–145)
TOTAL PROTEIN: 7.6 g/dL (ref 6.5–8.1)

## 2016-07-15 LAB — CBC
HCT: 39 % (ref 35.0–47.0)
HEMOGLOBIN: 12.9 g/dL (ref 12.0–16.0)
MCH: 27.7 pg (ref 26.0–34.0)
MCHC: 33 g/dL (ref 32.0–36.0)
MCV: 84 fL (ref 80.0–100.0)
PLATELETS: 351 10*3/uL (ref 150–440)
RBC: 4.65 MIL/uL (ref 3.80–5.20)
RDW: 15.6 % — ABNORMAL HIGH (ref 11.5–14.5)
WBC: 16.1 10*3/uL — ABNORMAL HIGH (ref 3.6–11.0)

## 2016-07-15 LAB — POCT PREGNANCY, URINE: Preg Test, Ur: NEGATIVE

## 2016-07-15 LAB — LIPASE, BLOOD: Lipase: 25 U/L (ref 11–51)

## 2016-07-15 MED ORDER — PHENAZOPYRIDINE HCL 95 MG PO TABS: 190.0000 mg | ORAL_TABLET | Freq: Three times a day (TID) | ORAL | 0 refills | Status: DC | PRN

## 2016-07-15 MED ORDER — CEPHALEXIN 500 MG PO CAPS
500.0000 mg | ORAL_CAPSULE | Freq: Once | ORAL | Status: AC
  Administered 2016-07-15: 500 mg via ORAL
  Filled 2016-07-15: qty 1

## 2016-07-15 MED ORDER — CEPHALEXIN 500 MG PO CAPS: 500.0000 mg | ORAL_CAPSULE | Freq: Three times a day (TID) | ORAL | 0 refills | Status: DC

## 2016-07-15 MED ORDER — OXYCODONE-ACETAMINOPHEN 5-325 MG PO TABS: 1.0000 | ORAL_TABLET | ORAL | 0 refills | Status: DC | PRN

## 2016-07-15 MED ORDER — OXYCODONE-ACETAMINOPHEN 5-325 MG PO TABS
1.0000 | ORAL_TABLET | Freq: Once | ORAL | Status: AC
  Administered 2016-07-15: 1 via ORAL
  Filled 2016-07-15: qty 1

## 2016-07-15 NOTE — ED Triage Notes (Signed)
Patient reports right lower abdominal pain for past 2-3 days.  Patient denies nausea, vomiting or diarrhea.  Patient reports history of ovarian cyst.  Patient smiling and laughing during triage.

## 2016-07-15 NOTE — Discharge Instructions (Signed)

## 2016-07-15 NOTE — ED Notes (Signed)
Pt c/o right lower quadrant pain for 2-3 days; thinks it's an ovarian cyst as she has history of same; says when she lays down she can feel a "knot" in the area; pt in no distress at this time

## 2016-07-15 NOTE — ED Provider Notes (Signed)
[facility] Emergency Department Provider Note  ____________________________________________   First MD Initiated Contact with Patient 07/15/16 0406     (approximate)  I have reviewed the triage vital signs and the nursing notes.   HISTORY  Chief Complaint Abdominal Pain    HPI Courtney Schwartz is a 31 y.o. female Who presents for evaluation of gradual onset and slowly worsening sharp pain far down in her right lower quadrant.  It feels similar to prior pain from ovarian cysts.  She denies fever/chills, chest pain, shortness of breath, nausea, vomiting, abdominal pain, diarrhea.  She has been eating and drinking normally and has also had no dysuria.  She has had urinary tract infections in the past and also has had kidney stones but she states that this does not feel like that.  She reports the pain is moderate at times and ibuprofen has not been helping.  She has no reported history of abdominal surgeries.  She reports that her last menstrual period finished about 8 days ago.   Past Medical History:  Diagnosis Date  . Chronic pain   . Kidney stones     There are no active problems to display for this patient.   History reviewed. No pertinent surgical history.  Prior to Admission medications   Medication Sig Start Date End Date Taking? Authorizing Provider  cephALEXin (KEFLEX) 500 MG capsule Take 1 capsule (500 mg total) by mouth 3 (three) times daily. 07/15/16   Loleta Rose, MD  naproxen (NAPROSYN) 500 MG tablet Take 1 tablet (500 mg total) by mouth 2 (two) times daily with a meal. 03/21/15   Delorise Royals Cuthriell, PA-C  oxyCODONE-acetaminophen (ROXICET) 5-325 MG tablet Take 1-2 tablets by mouth every 4 (four) hours as needed for severe pain. 07/15/16   Loleta Rose, MD  phenazopyridine (PYRIDIUM) 95 MG tablet Take 2 tablets (190 mg total) by mouth 3 (three) times daily with meals as needed for pain (bladder pain/spasms). 07/15/16   Loleta Rose, MD     Allergies Morphine and related; Phenergan [promethazine hcl]; Toradol [ketorolac tromethamine]; and Tramadol  History reviewed. No pertinent family history.  Social History Social History  Substance Use Topics  . Smoking status: Current Every Day Smoker  . Smokeless tobacco: Never Used  . Alcohol use No     Comment: hx of alcohol abuse, says she no longer drinks    Review of Systems Constitutional: No fever/chills Eyes: No visual changes. ENT: No sore throat. Cardiovascular: Denies chest pain. Respiratory: Denies shortness of breath. Gastrointestinal: Right lower quadrant pain 2-3 days.  No nausea, no vomiting.  No diarrhea.  No constipation. Genitourinary: Negative for dysuria. Musculoskeletal: Negative for back pain. Integumentary: Negative for rash. Neurological: Negative for headaches, focal weakness or numbness.   ____________________________________________   PHYSICAL EXAM:  VITAL SIGNS: ED Triage Vitals  Enc Vitals Group     BP 07/15/16 0115 (!) 126/96     Pulse Rate 07/15/16 0115 64     Resp 07/15/16 0115 20     Temp 07/15/16 0115 98.3 F (36.8 C)     Temp Source 07/15/16 0115 Oral     SpO2 07/15/16 0115 97 %     Weight 07/15/16 0116 137 lb (62.1 kg)     Height 07/15/16 0116  (1.549 m)     Head Circumference --      Peak Flow --      Pain Score 07/15/16 0114 10     Pain Loc --  Pain Edu? --      Excl. in GC? --     Constitutional: Alert and oriented. Well appearing and in no acute distress.   Eyes: Conjunctivae are normal. PERRL. EOMI. Head: Atraumatic. Nose: No congestion/rhinnorhea. Mouth/Throat: Mucous membranes are moist. Neck: No stridor.  No meningeal signs.   Cardiovascular: Normal rate, regular rhythm. Good peripheral circulation. Grossly normal heart sounds. Respiratory: Normal respiratory effort.  No retractions. Lungs CTAB. Gastrointestinal: Soft and nontender including at McBurney's point.  She has some tenderness in her  right groin but with no palpable deformities or bulges. Musculoskeletal: No lower extremity tenderness nor edema. No gross deformities of extremities.  No CVA tenderness. Neurologic:  Normal speech and language. No gross focal neurologic deficits are appreciated.  Skin:  Skin is warm, dry and intact. No rash noted. Psychiatric: Mood and affect are normal. Speech and behavior are normal.  ____________________________________________   LABS (all labs ordered are listed, but only abnormal results are displayed)  Labs Reviewed  COMPREHENSIVE METABOLIC PANEL - Abnormal; Notable for the following:       Result Value   ALT 10 (*)    All other components within normal limits  CBC - Abnormal; Notable for the following:    WBC 16.1 (*)    RDW 15.6 (*)    All other components within normal limits  URINALYSIS, COMPLETE (UACMP) WITH MICROSCOPIC - Abnormal; Notable for the following:    Color, Urine AMBER (*)    APPearance TURBID (*)    Hgb urine dipstick MODERATE (*)    Protein, ur 100 (*)    Leukocytes, UA LARGE (*)    Bacteria, UA FEW (*)    Squamous Epithelial / LPF 6-30 (*)    All other components within normal limits  LIPASE, BLOOD  POC URINE PREG, ED  POCT PREGNANCY, URINE   ____________________________________________  EKG  None - EKG not ordered by ED physician ____________________________________________  RADIOLOGY   No results found.  ____________________________________________   PROCEDURES  Critical Care performed: No   Procedure(s) performed:   Procedures   ____________________________________________   INITIAL IMPRESSION / ASSESSMENT AND PLAN / ED COURSE  Pertinent labs & imaging results that were available during my care of the patient were reviewed by me and considered in my medical decision making (see chart for details).  The patient is well-appearing and in no acute distress with normal vital signs and afebrile.  She does have a moderate  leukocytosis and what appears to be grossly infected urine.  She has no evidence of pyelonephritis and she has no CVA tenderness.  She has no significant abdominal tenderness to palpation and tender she does have is down in her groin.  However she has no evidence of a hernia and certainly no evidence of bowel obstruction.  I think it is appropriate to treat her urinary tract infection and have her follow-up as an outpatient.  She is comfortable with this plan as well.  I will give her a very short course of Norco for the pain that she is reporting and encouraged close outpatient follow-up both with urology since she apparently has had kidney stones and UTIs in the past as well as with a new primary care provider.  I gave my usual and customary return precautions.      Clinical Course as of Jul 15 453  Sun Jul 15, 2016  0419 I reviewed the patient's prescription history over the last 12 months in the multi-state controlled substances database(s) that  includes [place], [place], [place], [place], [place], [place], [place], [place], [place], [place], [place], [place], [place], and [place].  Results were notable for multiple prescriptions for Percocet from a dentist almost a year ago and a brief course of Suboxone that the patient says was used to help get her off of Xanax.  She has had no prescriptions filled for at least 9 months.  [CF]    Clinical Course User Index [CF] Loleta Rose, MD    ____________________________________________  FINAL CLINICAL IMPRESSION(S) / ED DIAGNOSES  Final diagnoses:  Urinary tract infection with hematuria, site unspecified  Right groin pain     MEDICATIONS GIVEN DURING THIS VISIT:  Medications  cephALEXin (KEFLEX) capsule 500 mg (not administered)  oxyCODONE-acetaminophen (PERCOCET/ROXICET) 5-325 MG per tablet 1 tablet (not administered)     NEW OUTPATIENT MEDICATIONS STARTED DURING THIS VISIT:  New Prescriptions    CEPHALEXIN (KEFLEX) 500 MG CAPSULE    Take 1 capsule (500 mg total) by mouth 3 (three) times daily.   OXYCODONE-ACETAMINOPHEN (ROXICET) 5-325 MG TABLET    Take 1-2 tablets by mouth every 4 (four) hours as needed for severe pain.   PHENAZOPYRIDINE (PYRIDIUM) 95 MG TABLET    Take 2 tablets (190 mg total) by mouth 3 (three) times daily with meals as needed for pain (bladder pain/spasms).    Modified Medications   No medications on file    Discontinued Medications   No medications on file     Note:  This document was prepared using Dragon voice recognition software and may include unintentional dictation errors.    Loleta Rose, MD 07/15/16 [phone]

## 2017-06-30 IMAGING — CR DG FOOT COMPLETE 3+V*L*
1 series · 3 of 3 positions shown · non-contrast
Comparison: None.

CLINICAL DATA: Lateral left foot pain after injury. Fall down 8
steps today. Heard a "crack "

EXAM:
LEFT FOOT - COMPLETE 3+ VIEW

[Series 1: x foot ap left · 0.14mm/px · 3 of 3 slices shown]
[im 1/3]
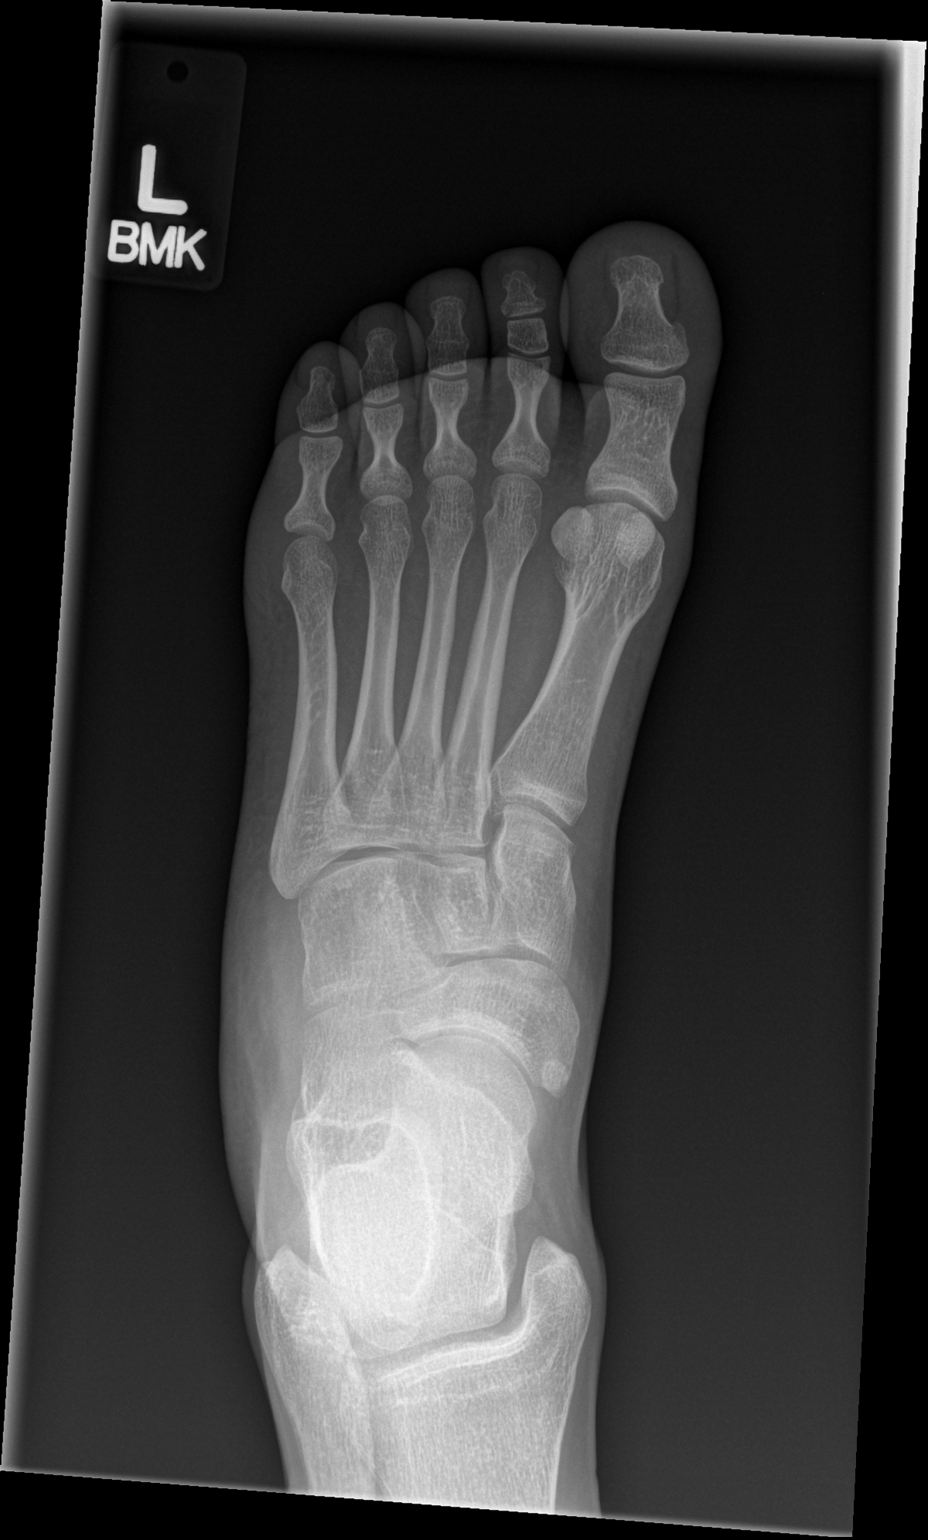
[im 2/3]
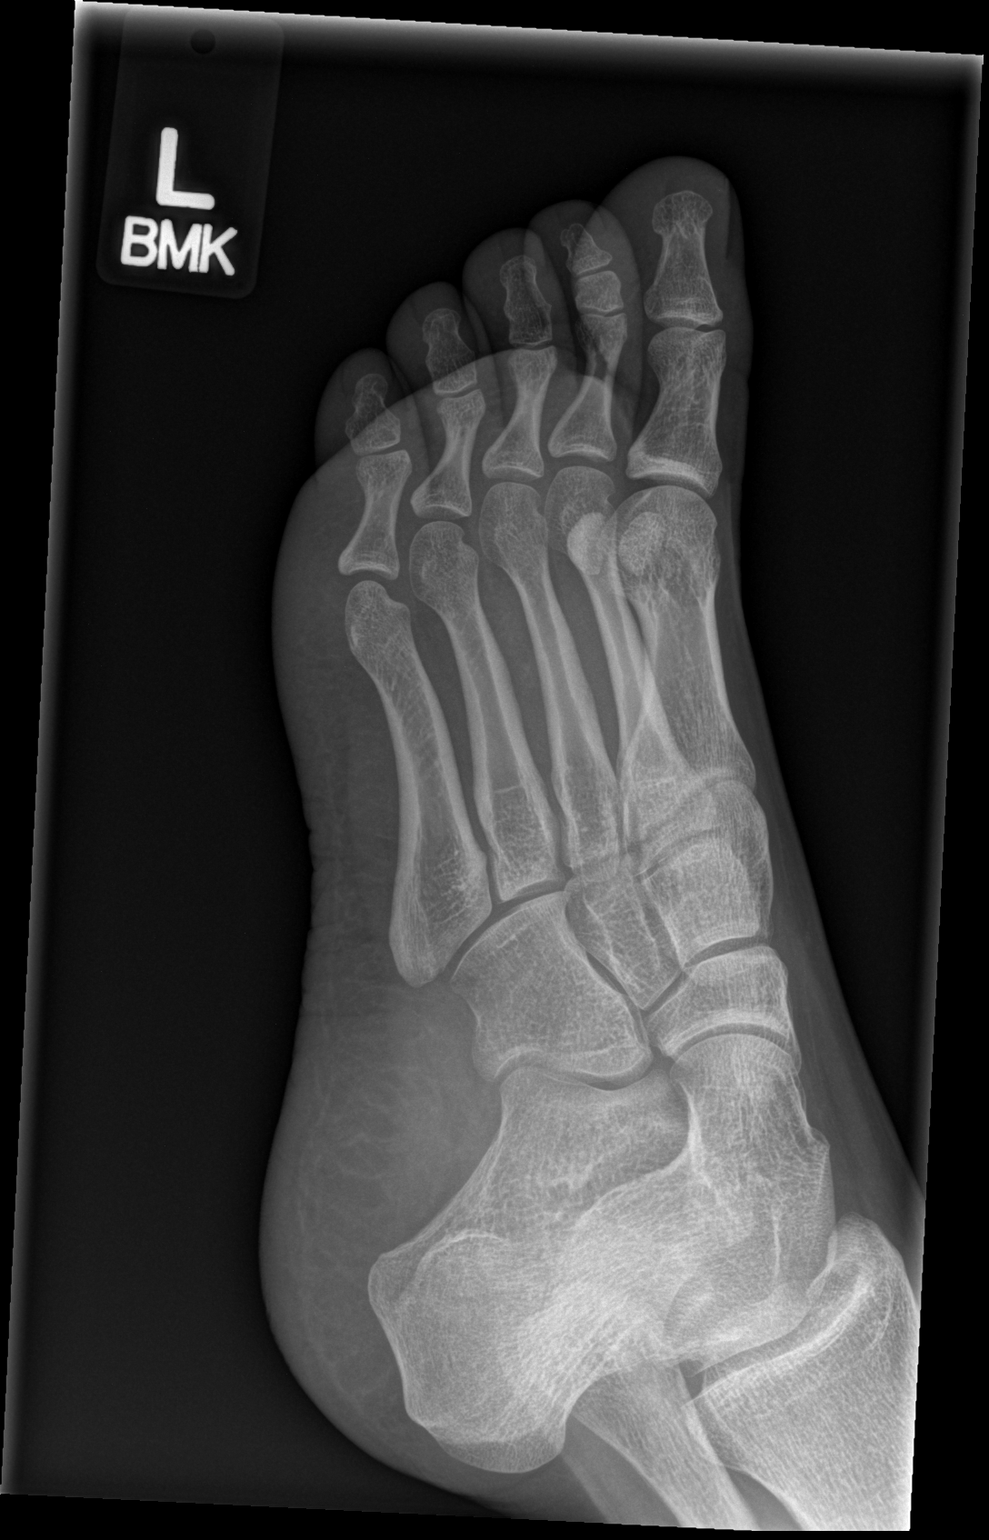
[im 3/3]
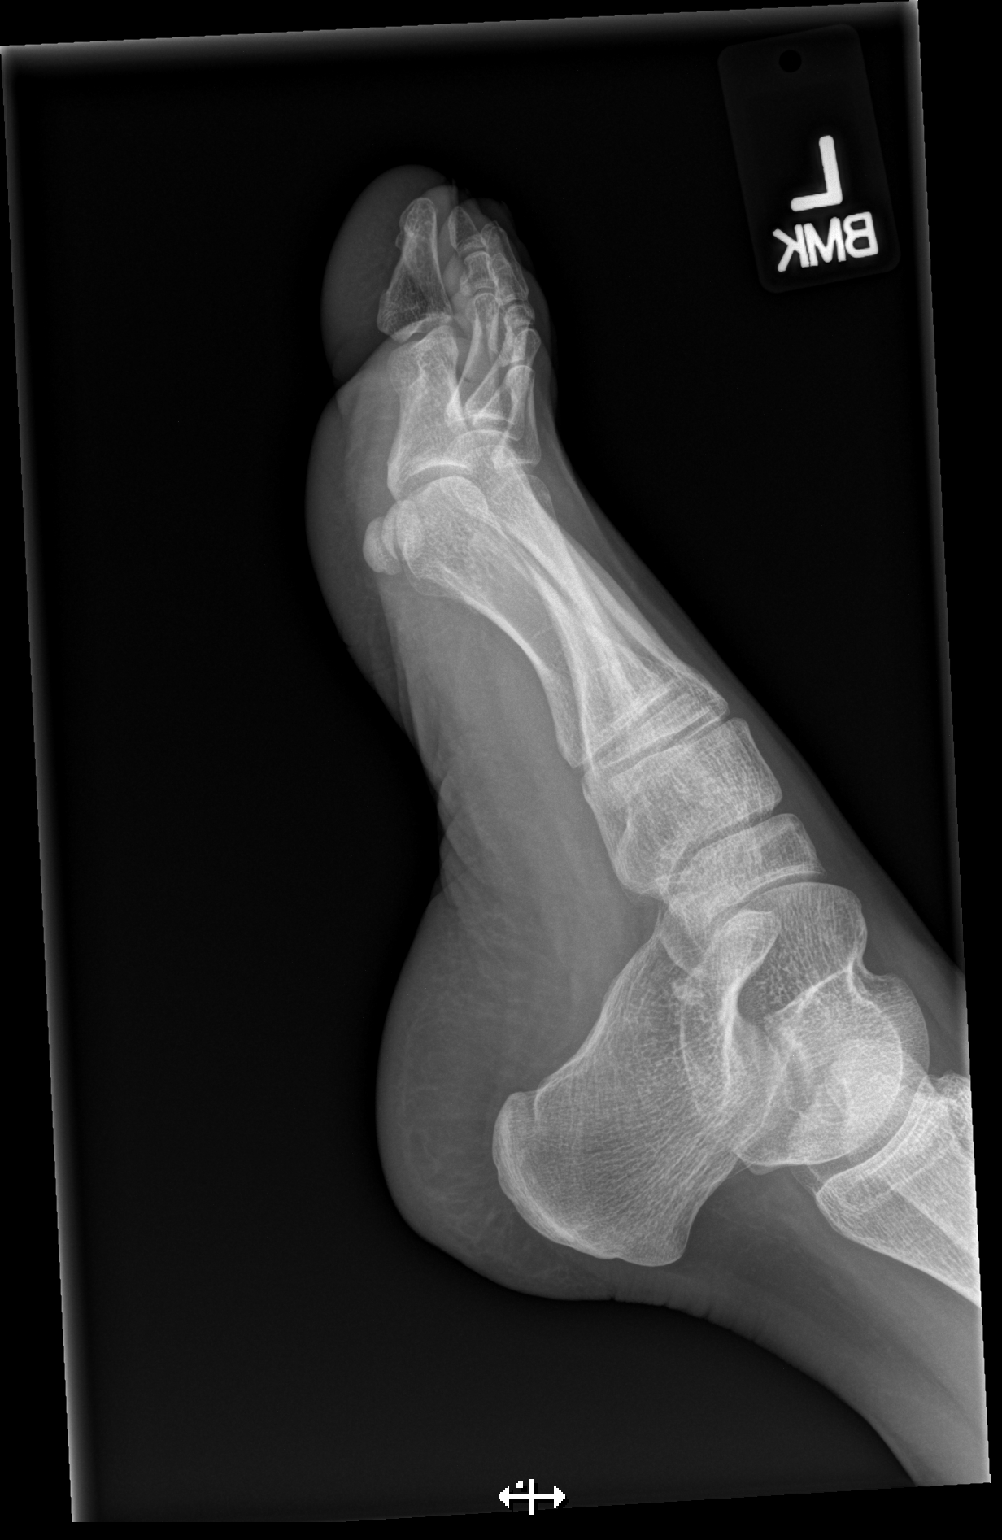

[3 of 3 positions shown; findings below may reference images not displayed]

FINDINGS: There is no fracture or dislocation. There is no evidence of
arthropathy or other focal bone abnormality. Soft tissues are
unremarkable.
IMPRESSION: No fracture or dislocation of the left foot.

## 2021-11-30 ENCOUNTER — Other Ambulatory Visit: Payer: Self-pay

## 2021-11-30 ENCOUNTER — Emergency Department: Payer: BLUE CROSS/BLUE SHIELD

## 2021-11-30 ENCOUNTER — Encounter: Payer: Self-pay | Admitting: Intensive Care

## 2021-11-30 ENCOUNTER — Emergency Department
Admission: EM | Admit: 2021-11-30 | Discharge: 2021-11-30 | Disposition: A | Discharge status: Home / Self Care | Payer: BLUE CROSS/BLUE SHIELD | Attending: Emergency Medicine | Admitting: Emergency Medicine

## 2021-11-30 DIAGNOSIS — G43819 Other migraine, intractable, without status migrainosus: Secondary | ICD-10-CM | POA: Diagnosis not present

## 2021-11-30 DIAGNOSIS — U071 COVID-19: Secondary | ICD-10-CM | POA: Diagnosis not present

## 2021-11-30 DIAGNOSIS — R519 Headache, unspecified: Secondary | ICD-10-CM | POA: Diagnosis present

## 2021-11-30 LAB — CBC WITH DIFFERENTIAL/PLATELET
Abs Immature Granulocytes: 0.08 10*3/uL — ABNORMAL HIGH (ref 0.00–0.07)
Basophils Absolute: 0 10*3/uL (ref 0.0–0.1)
Basophils Relative: 0 %
Eosinophils Absolute: 0 10*3/uL (ref 0.0–0.5)
Eosinophils Relative: 0 %
HCT: 39.2 % (ref 36.0–46.0)
Hemoglobin: 12.3 g/dL (ref 12.0–15.0)
Immature Granulocytes: 1 %
Lymphocytes Relative: 7 %
Lymphs Abs: 0.7 10*3/uL (ref 0.7–4.0)
MCH: 26.7 pg (ref 26.0–34.0)
MCHC: 31.4 g/dL (ref 30.0–36.0)
MCV: 85.2 fL (ref 80.0–100.0)
Monocytes Absolute: 1.1 10*3/uL — ABNORMAL HIGH (ref 0.1–1.0)
Monocytes Relative: 10 %
Neutro Abs: 8.6 10*3/uL — ABNORMAL HIGH (ref 1.7–7.7)
Neutrophils Relative %: 82 %
Platelets: 355 10*3/uL (ref 150–400)
RBC: 4.6 MIL/uL (ref 3.87–5.11)
RDW: 13.8 % (ref 11.5–15.5)
WBC: 10.6 10*3/uL — ABNORMAL HIGH (ref 4.0–10.5)
nRBC: 0 % (ref 0.0–0.2)

## 2021-11-30 LAB — URINALYSIS, ROUTINE W REFLEX MICROSCOPIC
Bilirubin Urine: NEGATIVE
Glucose, UA: NEGATIVE mg/dL
Hgb urine dipstick: NEGATIVE
Ketones, ur: NEGATIVE mg/dL
Nitrite: NEGATIVE
Protein, ur: NEGATIVE mg/dL
Specific Gravity, Urine: 1.009 (ref 1.005–1.030)
pH: 6 (ref 5.0–8.0)

## 2021-11-30 LAB — RESP PANEL BY RT-PCR (FLU A&B, COVID) ARPGX2
Influenza A by PCR: NEGATIVE
Influenza B by PCR: NEGATIVE
SARS Coronavirus 2 by RT PCR: POSITIVE — AB

## 2021-11-30 LAB — BASIC METABOLIC PANEL
Anion gap: 10 (ref 5–15)
BUN: 8 mg/dL (ref 6–20)
CO2: 21 mmol/L — ABNORMAL LOW (ref 22–32)
Calcium: 8.8 mg/dL — ABNORMAL LOW (ref 8.9–10.3)
Chloride: 106 mmol/L (ref 98–111)
Creatinine, Ser: 0.57 mg/dL (ref 0.44–1.00)
GFR, Estimated: >60 mL/min (ref >60)
Glucose, Bld: 100 mg/dL — ABNORMAL HIGH (ref 70–99)
Potassium: 3.7 mmol/L (ref 3.5–5.1)
Sodium: 137 mmol/L (ref 135–145)

## 2021-11-30 LAB — POC URINE PREG, ED: Preg Test, Ur: NEGATIVE

## 2021-11-30 LAB — GROUP A STREP BY PCR: Group A Strep by PCR: NOT DETECTED

## 2021-11-30 MED ORDER — DIPHENHYDRAMINE HCL 50 MG/ML IJ SOLN
12.5000 mg | Freq: Once | INTRAMUSCULAR | Status: AC
  Administered 2021-11-30: 12.5 mg via INTRAVENOUS
  Filled 2021-11-30: qty 1

## 2021-11-30 MED ORDER — ONDANSETRON 4 MG PO TBDP: 4.0000 mg | ORAL_TABLET | Freq: Three times a day (TID) | ORAL | 0 refills | Status: AC | PRN

## 2021-11-30 MED ORDER — SODIUM CHLORIDE 0.9 % IV BOLUS
1000.0000 mL | Freq: Once | INTRAVENOUS | Status: AC
  Administered 2021-11-30: 1000 mL via INTRAVENOUS

## 2021-11-30 MED ORDER — KETOROLAC TROMETHAMINE 15 MG/ML IJ SOLN
15.0000 mg | Freq: Once | INTRAMUSCULAR | Status: AC
  Administered 2021-11-30: 15 mg via INTRAVENOUS
  Filled 2021-11-30: qty 1

## 2021-11-30 MED ORDER — METOCLOPRAMIDE HCL 5 MG/ML IJ SOLN
10.0000 mg | Freq: Once | INTRAMUSCULAR | Status: AC
  Administered 2021-11-30: 10 mg via INTRAVENOUS
  Filled 2021-11-30: qty 2

## 2021-11-30 MED ORDER — ACETAMINOPHEN 500 MG PO TABS
1000.0000 mg | ORAL_TABLET | Freq: Once | ORAL | Status: AC
  Administered 2021-11-30: 1000 mg via ORAL
  Filled 2021-11-30: qty 2

## 2021-11-30 MED ORDER — NIRMATRELVIR/RITONAVIR (PAXLOVID)TABLET: 3.0000 | ORAL_TABLET | Freq: Two times a day (BID) | ORAL | 0 refills | Status: AC

## 2021-11-30 MED ORDER — IBUPROFEN 600 MG PO TABS: 600.0000 mg | ORAL_TABLET | Freq: Four times a day (QID) | ORAL | 0 refills | Status: AC | PRN

## 2021-11-30 NOTE — ED Provider Notes (Signed)
[facility] Provider Note    Event Date/Time   First MD Initiated Contact with Patient 11/30/21 1439     (approximate)   History   Otalgia and Headache   HPI  Courtney Schwartz is a 36 y.o. female who comes in with headache, body aches, ear pain.  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  She reports having a fever and taking ibuprofen this morning.  She does report a little bit of coughing as well.  She reports having a headache but has a history of migraines and they typically flareup when she is sick.  She denies any vision changes.  Denies any weakness in 1 arm or leg.   Physical Exam   Triage Vital Signs: ED Triage Vitals  Enc Vitals Group     BP 11/30/21 1412 (!) 146/97     Pulse Rate 11/30/21 1412 (!) 123     Resp 11/30/21 1412 20     Temp 11/30/21 1412 98.2 F (36.8 C)     Temp Source 11/30/21 1412 Oral     SpO2 11/30/21 1412 96 %     Weight 11/30/21 1402 136 lb 14.5 oz (62.1 kg)     Height 11/30/21 1402 5\' 1"  (1.549 m)     Head Circumference --      Peak Flow --      Pain Score 11/30/21 1401 10     Pain Loc --      Pain Edu? --      Excl. in GC? --     Most recent vital signs: Vitals:   11/30/21 1412  BP: (!) 146/97  Pulse: (!) 123  Resp: 20  Temp: 98.2 F (36.8 C)  SpO2: 96%     General: Awake, no distress.  CV:  Good peripheral perfusion.  Tachycardic.   Resp:  Normal effort.  Abd:  No distention.  Soft and nontender Other:  Equal strength in arms and legs.  Oral pharynx without any obvious exudates or peritonsillar swelling.  Able to range her neck.TMs are clear bilaterally.   ED Results / Procedures / Treatments   Labs (all labs ordered are listed, but only abnormal results are displayed) Labs Reviewed  CBC WITH DIFFERENTIAL/PLATELET - Abnormal; Notable for the following components:      Result Value   WBC 10.6 (*)    Neutro Abs 8.6 (*)    Monocytes Absolute 1.1 (*)    Abs  Immature Granulocytes 0.08 (*)    All other components within normal limits  BASIC METABOLIC PANEL - Abnormal; Notable for the following components:   CO2 21 (*)    Glucose, Bld 100 (*)    Calcium 8.8 (*)    All other components within normal limits  GROUP A STREP BY PCR  RESP PANEL BY RT-PCR (FLU A&B, COVID) [id number]  URINALYSIS, ROUTINE W REFLEX MICROSCOPIC  POC URINE PREG, ED      RADIOLOGY I have reviewed the xray personally and interpreted no evidence of any pneumonia  PROCEDURES:  Critical Care performed: No  Procedures   MEDICATIONS ORDERED IN ED: Medications  acetaminophen (TYLENOL) tablet 1,000 mg (has no administration in time range)  sodium chloride 0.9 % bolus 1,000 mL (has no administration in time range)  metoCLOPramide (REGLAN) injection 10 mg (has no administration in time range)  diphenhydrAMINE (BENADRYL) injection 12.5 mg (has no administration in time range)     IMPRESSION / MDM / ASSESSMENT  AND PLAN / ED COURSE  I reviewed the triage vital signs and the nursing notes.   Patient's presentation is most consistent with acute presentation with potential threat to life or bodily function.   She will into fluids COVID, flu, UTI, strep Electra abnormalities given patient's tachycardia will get lab work give some IV fluids and migraine cocktail.  Her neuro exam is reassuring she has no evidence of otitis media or peritonsillar abscess on examination.  BMP reassuring.  Urine without evidence of UTI given significant mount of squamous cells.  Pregnancy test was negative.  Strep test was negative.  COVID test was positive.  Reevaluated patient she was feeling better after medications but will give a dose of Toradol before she goes.  Patient prescribed some medications we discussed benefits of risk of Paxlovid and she would like to proceed.  Given how significant her symptoms are with the tachycardia I think that this is appropriate even though overall she is low  risk.    FINAL CLINICAL IMPRESSION(S) / ED DIAGNOSES   Final diagnoses:  COVID-19  Other migraine without status migrainosus, intractable     Rx / DC Orders   ED Discharge Orders          Ordered    nirmatrelvir/ritonavir EUA (PAXLOVID) 20 x 150 MG & 10 x 100MG  TABS  2 times daily        11/30/21 1717    ondansetron (ZOFRAN-ODT) 4 MG disintegrating tablet  Every 8 hours PRN        11/30/21 1717    ibuprofen (ADVIL) 600 MG tablet  Every 6 hours PRN        11/30/21 1717             Note:  This document was prepared using Dragon voice recognition software and may include unintentional dictation errors.   12/02/21, MD 11/30/21 1719

## 2021-11-30 NOTE — Discharge Instructions (Signed)
You are positive for COVID you can take Tylenol 1 g every 8 hours and alternate with the ibuprofen and the Zofran to help with nausea.  You can use the antiviral medication and return to the ER if develop worsening shortness of breath or any other concerns

## 2021-11-30 NOTE — ED Notes (Signed)
89 yof with a c/c of headache and sore throat since yesterday. 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 Pt ambulatory to the room. The pt is warm, pink, and dry. The pt is alert and oriented x 4.

## 2021-11-30 NOTE — ED Triage Notes (Signed)
Patient c/o headache, ear pain and body aches that started this AM
